# Patient Record
Sex: Male | Born: 1995 | Race: Black or African American | Hispanic: No | Marital: Single | Smoking: Never smoker
Health system: Southern US, Community
[De-identification: ages and names within clinical notes are randomized; demographics above are authoritative.]

## PROBLEM LIST (undated history)

## (undated) DIAGNOSIS — R319 Hematuria, unspecified: Secondary | ICD-10-CM

## (undated) DIAGNOSIS — Z973 Presence of spectacles and contact lenses: Secondary | ICD-10-CM

## (undated) DIAGNOSIS — N201 Calculus of ureter: Secondary | ICD-10-CM

## (undated) DIAGNOSIS — D573 Sickle-cell trait: Secondary | ICD-10-CM

## (undated) HISTORY — PX: NO PAST SURGERIES: SHX2092

---
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
## 2017-03-09 IMAGING — US US RENAL
1 series · 14 of 25 positions shown · non-contrast
Comparison: CT abdomen and pelvis May 23, 2015

CLINICAL DATA: Right flank pain

EXAM:
RENAL ULTRASOUND

[Series 1: us renal · 0.22mm/px · 14 of 34 slices shown]
[im 1/34]
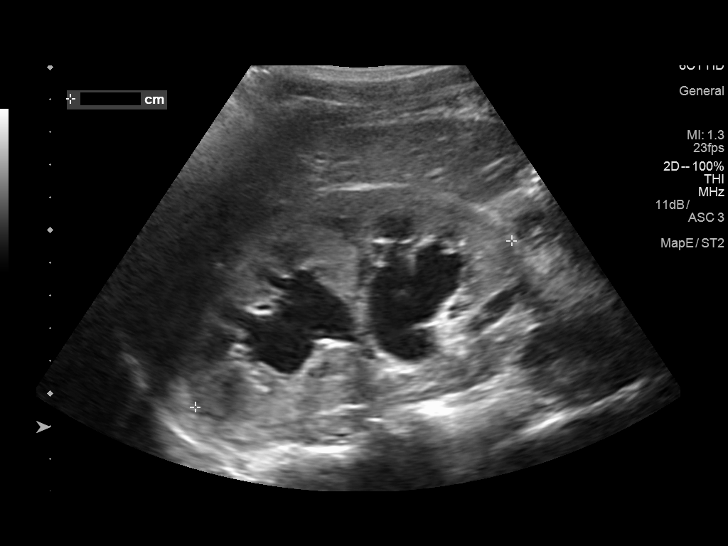
[im 3/34]
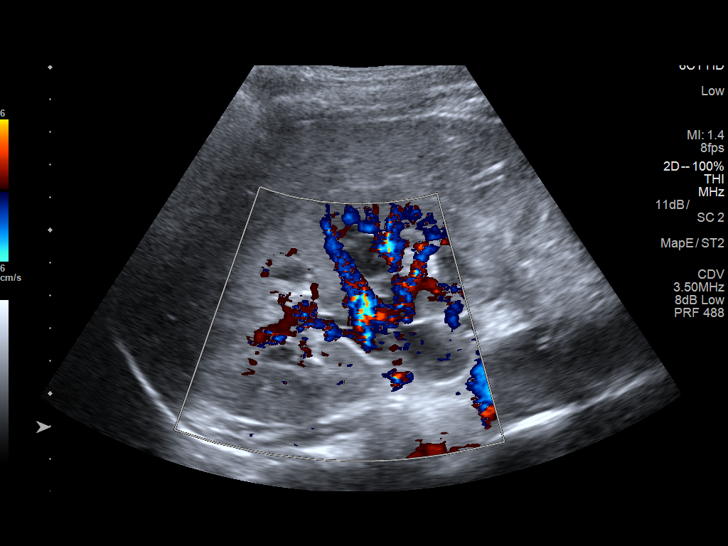
[im 6/34]
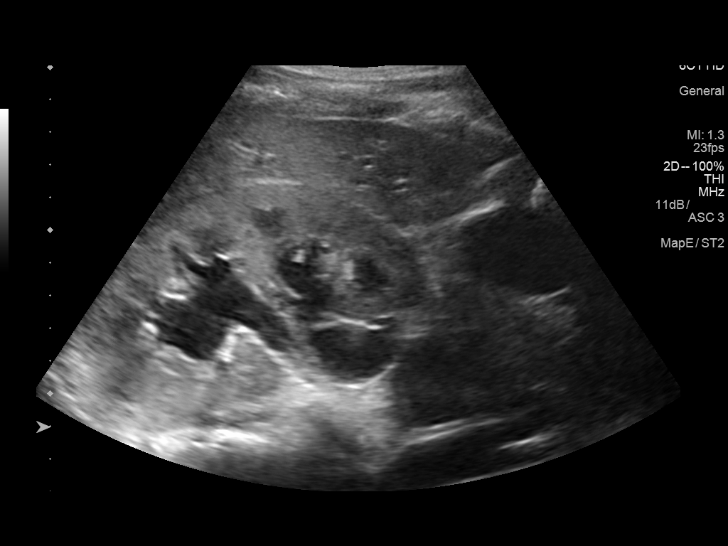
[im 9/34]
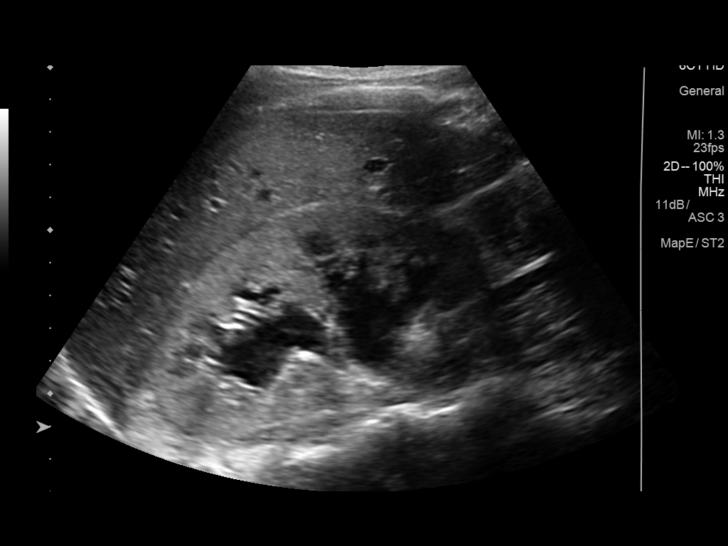
[im 12/34]
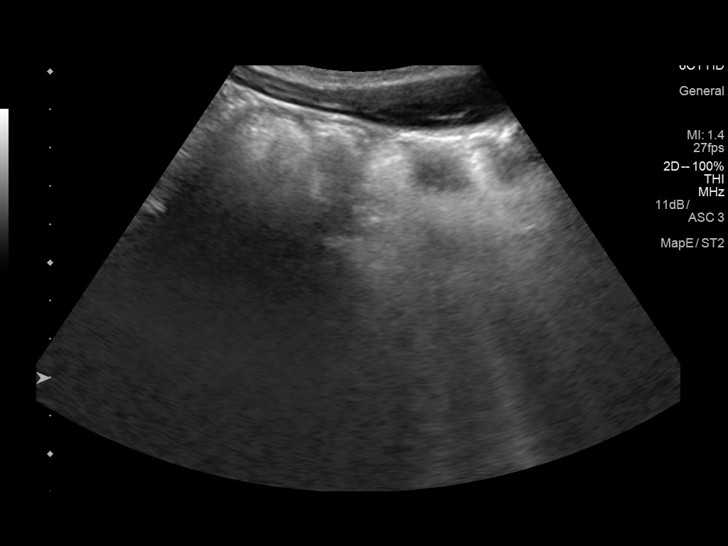
[im 13/34]
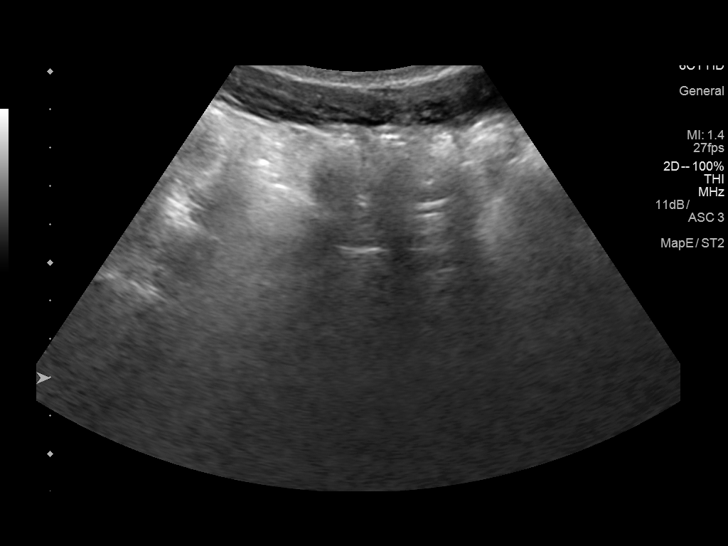
[im 16/34]
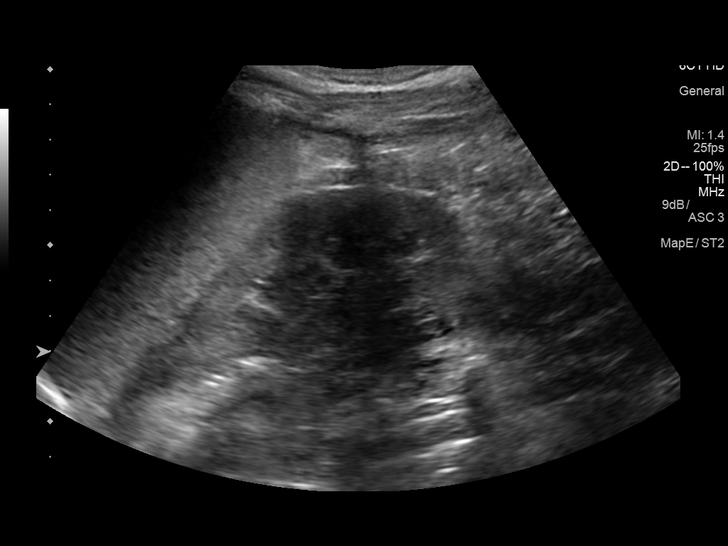
[im 18/34]
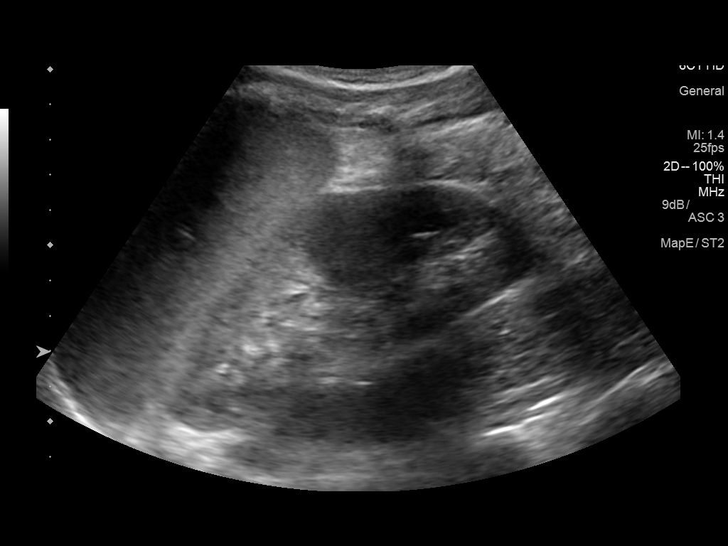
[im 21/34]
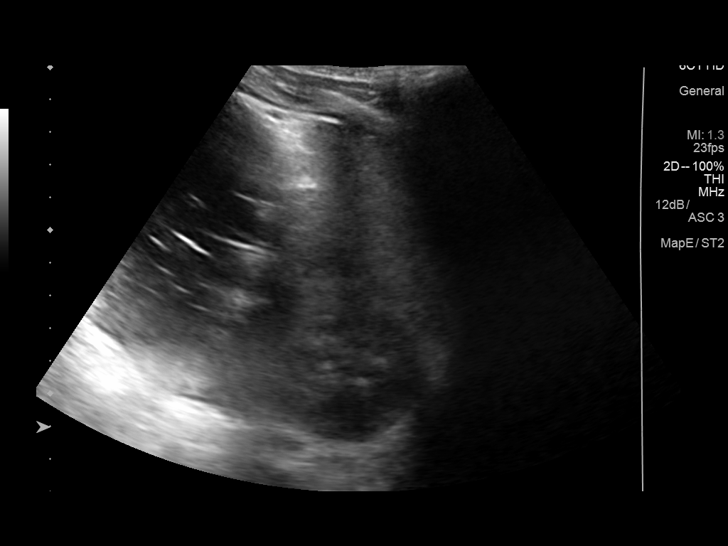
[im 23/34]
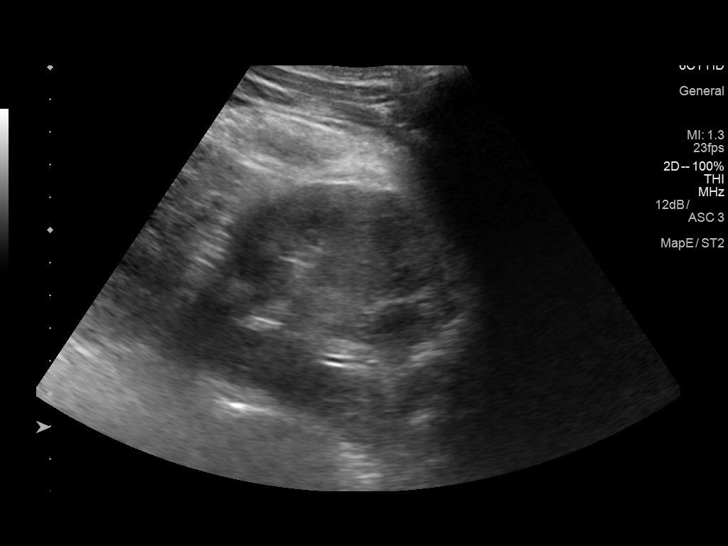
[im 25/34]
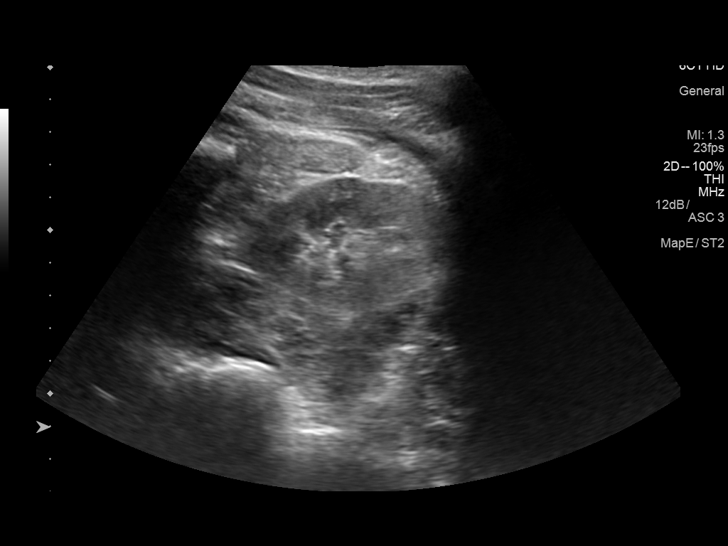
[im 28/34]
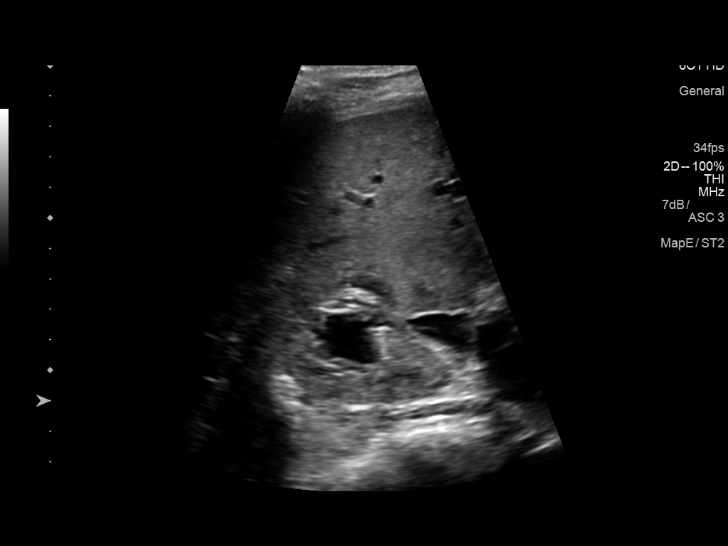
[im 31/34]
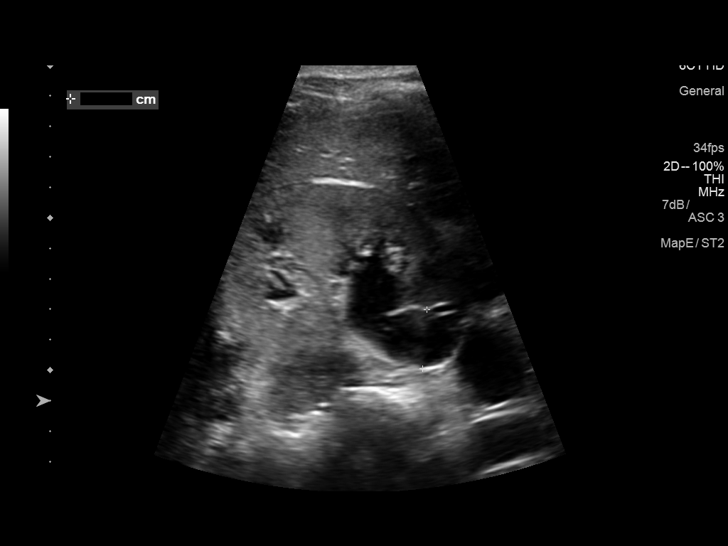
[im 34/34]
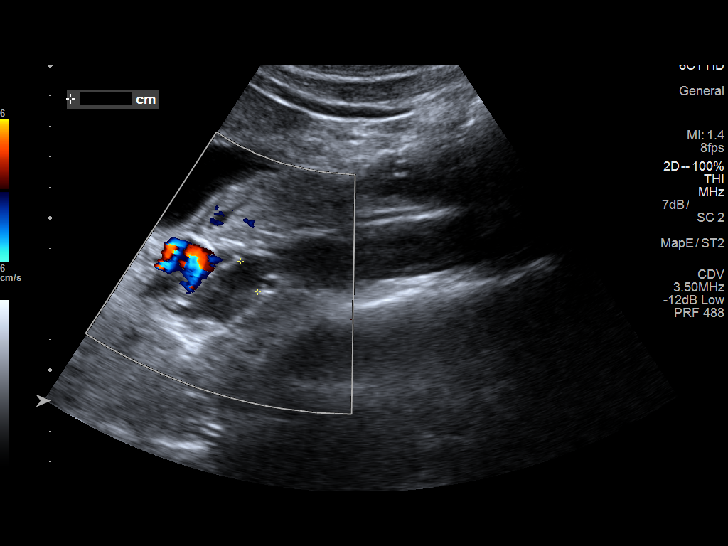

[14 of 25 positions shown; findings below may reference images not displayed]

FINDINGS: Right Kidney:

Length: 11.0 cm. Echogenicity and renal cortical thickness are
within normal limits. No mass or perinephric fluid. Visualized.
There is moderate hydronephrosis on the right. There is proximal
ureterectasis on the right. No intrarenal calculus is seen.

Left Kidney:

Length: 11.4 cm. Echogenicity and renal cortical thickness are
within normal limits. No mass, perinephric fluid, or hydronephrosis
visualized. No sonographically demonstrable calculus or
ureterectasis.

Bladder:

Empty and cannot be assessed on this study.
IMPRESSION: Moderate right-sided hydronephrosis and proximal ureterectasis
without obstructing focus demonstrated by ultrasound. This finding
may warrant CT for further assessment with respect to ureteral
calculus on the right. Study otherwise unremarkable. Note that the
urinary bladder is empty at this time.
